# Patient Record
Sex: Female | Born: 1985 | Race: White | Hispanic: No | Smoking: Current every day smoker
Health system: Southern US, Community
[De-identification: ages and names within clinical notes are randomized; demographics above are authoritative.]

## PROBLEM LIST (undated history)

## (undated) DIAGNOSIS — F32A Depression, unspecified: Secondary | ICD-10-CM

## (undated) DIAGNOSIS — F329 Major depressive disorder, single episode, unspecified: Secondary | ICD-10-CM

## (undated) DIAGNOSIS — F1991 Other psychoactive substance use, unspecified, in remission: Secondary | ICD-10-CM

## (undated) HISTORY — PX: MOUTH SURGERY: SHX715

---
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
## 2006-10-05 ENCOUNTER — Inpatient Hospital Stay (HOSPITAL_COMMUNITY): Admission: AD | Admit: 2006-10-05 | Discharge: 2006-10-05 | Payer: Self-pay | Admitting: Obstetrics and Gynecology

## 2006-10-07 ENCOUNTER — Inpatient Hospital Stay (HOSPITAL_COMMUNITY): Admission: AD | Admit: 2006-10-07 | Discharge: 2006-10-07 | Payer: Self-pay | Admitting: Obstetrics and Gynecology

## 2006-10-20 ENCOUNTER — Inpatient Hospital Stay (HOSPITAL_COMMUNITY): Admission: AD | Admit: 2006-10-20 | Discharge: 2006-10-20 | Payer: Self-pay | Admitting: Obstetrics & Gynecology

## 2006-10-21 ENCOUNTER — Inpatient Hospital Stay (HOSPITAL_COMMUNITY): Admission: AD | Admit: 2006-10-21 | Discharge: 2006-10-26 | Payer: Self-pay | Admitting: Obstetrics & Gynecology

## 2006-10-23 ENCOUNTER — Encounter (INDEPENDENT_AMBULATORY_CARE_PROVIDER_SITE_OTHER): Payer: Self-pay | Admitting: Obstetrics and Gynecology

## 2008-01-01 ENCOUNTER — Inpatient Hospital Stay (HOSPITAL_COMMUNITY): Admission: AD | Admit: 2008-01-01 | Discharge: 2008-01-01 | Payer: Self-pay | Admitting: Obstetrics and Gynecology

## 2008-04-25 ENCOUNTER — Inpatient Hospital Stay (HOSPITAL_COMMUNITY): Admission: AD | Admit: 2008-04-25 | Discharge: 2008-04-25 | Payer: Self-pay | Admitting: Obstetrics & Gynecology

## 2010-03-10 ENCOUNTER — Encounter: Payer: Self-pay | Admitting: Sports Medicine

## 2010-07-02 NOTE — Op Note (Signed)
NAMEPARRIS, SIGNER              ACCOUNT NO.:  [id number]   MEDICAL RECORD NO.:  [id number]          PATIENT TYPE:  INP   LOCATION:  9373                          FACILITY:  [facility]   PHYSICIAN:  Kendra H. Tenny Craw, MD     DATE OF BIRTH:  1986-02-11   DATE OF PROCEDURE:  10/23/2006  DATE OF DISCHARGE:                               OPERATIVE REPORT   PREOPERATIVE DIAGNOSES:  1. A 36 and 3-week intrauterine pregnancy.  2. Nonreassuring fetal well-being.  3. Preeclampsia.  4. Possible chin presentation.  5. Dysfunctional labor.   POSTOPERATIVE DIAGNOSES:  1. A 36 and 3-week intrauterine pregnancy.  2. Nonreassuring fetal well-being.  3. Preeclampsia.  4. Possible chin presentation.  5. Dysfunctional labor.  6. Occiput posterior presentation, asynclitic.  7. Nuchal cord x2.   PROCEDURE:  Primary low transverse cesarean section via Pfannenstiel's  skin incision.   SURGEON:  Freddrick March. Tenny Craw, M.D.   ASSISTANT:  None.   ANESTHESIA:  Epidural.   SPECIMENS:  Placenta.  Placenta for disposal following the procedure.   ESTIMATED BLOOD LOSS:  600 mL.   COMPLICATIONS:  None.   FINDINGS:  Vigorous female infant in vertex and slightly asynclitic  occiput posterior presentation weighing 4 pounds 15 ounces with Apgar  scores of 9 and 9.  Cord pH was 7.33.  Normal-appearing ovaries, tubes  and uterus.   PROCEDURE:  Ms. Breeding is a 25 year old G1 P0 who presented for  evaluation of elevated blood pressures on October 21, 2006.  She had  been followed at the end of this pregnancy for elevated blood pressures  noted to be having blood pressures of 140s-150s over 80s-90s in the  office earlier in the week and monitoring her blood pressures at home.  Upon presentation on September 3, she was noted to have blood pressures  in the 170s-110.  Preeclampsia labs at that time were normal, but she  did have 1+ proteinuria; however, otherwise was asymptomatic.  Given the  significant range blood  pressures and proteinuria, the decision was made  to admit the patient and induce labor.  She initially underwent  induction of labor with misoprostol followed by Pitocin.  An amniotomy  was performed at 1630 on October 22, 2006.  At that time, she was tight  3 cm dilated, 70% and -2 station.  An intrauterine pressure catheter and  fetal scalp electrode were placed at that time.  The patient was noted  to have a very dysfunctional labor pattern.  She was have a series of 4-  5 contractions back to back and then no contractions for 5 minutes.  Multiple attempts were made to adjust the Pitocin to achieve adequate  labor.  However, the patient never did achieve adequate labor.  At 2300,  she was evaluated because the intrauterine pressure catheter was no  longer registering contractions and to see if any further progress had  been made.  Upon cervical exam, the cervix was found to be 4-5 cm  dilated, completely effaced and -1 station.  The intrauterine pressure  catheter was removed and replaced.  Return of blood  was noted after  placement of the intrauterine pressure catheter.  The infant was noted  to have decelerations to 90s for approximately 2 minutes following  intrauterine pressure catheter placement.  However, this responded well  to scalp stimulation.  Position changes were attempted.  However, the  infant was noted to have had a baseline change.  Baseline was now 110-  120s.  Accelerations were noted.  However, on further cervical exam,  there was concern that the baby had a chin presentation.  Sutures were  ill defined, and given the inability to achieve adequate labor, fetal  intolerance of labor and concern for chin presentation, the decision was  made to proceed with primary cesarean section.   Following the appropriate informed consent, the patient was brought to  the operating room where epidural anesthesia was found to be adequate.  She was placed in the dorsal supine  position with a leftward tilt,  prepped and draped in normal sterile fashion.  Scalpel was used to make  a Pfannenstiel's skin incision which was carried down through the  underlying layers of soft tissue to the fascia.  Fascia was incised in  the midline.  Fascial incision was extended laterally with Mayo  scissors.  Superior aspect of the fascial incision was grasped with  Kocher clamps x2, tented up.  Underlying rectus muscle was dissected off  sharply with the electrocautery unit.  The rectus muscles were then  separated in the midline.  The abdominal peritoneum was identified and  entered up with blunt dissection, and the incision in the peritoneum was  extended superiorly and inferiorly with good visualization of the  bladder.  The Alexis retractor was then placed intra-abdominally and  erected in the appropriate fashion with care taken to assure that no  bowel or omentum was caught between the retractor and the anterior  abdominal wall.  The vesicouterine peritoneum was then identified,  tented up, and entered sharply with Metzenbaum scissors, and the  incision was extended laterally with Metzenbaums, and the bladder flap  was created digitally.  Scalpel was then used to make a low transverse  incision in the uterus which was extended laterally with blunt  dissection.  The fetal vertex was then identified and brought up through  the uterine incision followed by delivery of the body.  A nuchal cord x2  was noted.  The cord was clamped and cut.  The infant was passed to the  waiting pediatricians.  Cord pH was collected.  The placenta was then  spontaneously delivered.  The uterus was exteriorized, cleared of all  clot and debris.  Uterine incision was repaired with 0 chromic in a  running fashion with a second imbricating layer.  Uterine incision was  found to be hemostatic.  The ovaries and tubes were inspected and found  to be normal.  The uterus was returned to the abdominal  cavity.  The  abdominal cavity was cleared of all clot and debris.  Uterine incision  was reinspected and again found to be hemostatic.  The Alexis retractor  was then disassembled, and the abdominal peritoneum was identified and  closed with 2-0 Vicryl in a running fashion.  The fascia was then closed  with a looped PDS in a running fashion, and the skin was closed with  staples.  All sponge, lap and needle counts were correct x2.  The  patient tolerated the procedure well and was brought to the recovery  room in stable condition following the procedure.  Freddrick March. Tenny Craw, MD  Electronically Signed     KHR/MEDQ  D:  10/23/2006  T:  10/23/2006  Job:  [id number]

## 2010-07-05 NOTE — Discharge Summary (Signed)
Christine Fernandez, Christine Fernandez              ACCOUNT NO.:  [id number]   MEDICAL RECORD NO.:  [id number]          PATIENT TYPE:  INP   LOCATION:  9145                          FACILITY:  [facility]   PHYSICIAN:  Gerrit Friends. Aldona Bar, M.D.   DATE OF BIRTH:  09-Apr-1985   DATE OF ADMISSION:  10/21/2006  DATE OF DISCHARGE:  10/26/2006                               DISCHARGE SUMMARY   FINAL DIAGNOSES:  1. Intrauterine pregnancy at 38-3/7 weeks' gestation.  2. Nonreassuring fetal well-being.  3. Preeclampsia.  4. Dysfunctional labor.  5. Occiput posterior presentation/  6. Nuchal cord x2.   PROCEDURE:  Primary low transverse cesarean section.  Surgeon Dr. Waynard Reeds.  Complications none.   This 25 year old, G1, P0 presents on September 3 with some elevated  blood pressures.  The patient has been followed at the end of this  pregnancy for elevated blood pressures.  She has been asymptomatic and  has had reassuring fetal assessment.  Upon presentation, the patient's  blood pressures were 170s/110s, 1+ proteinuria, and she was admitted at  this time.  The patient's antepartum course up to this point had been  complicated by a history of smoking.  Cessation was discussed with the  patient throughout her pregnancy.  The patient also is being followed  for an abnormal Pap which will be repeated after the baby is born.  Otherwise, patient did have an abnormal 1-hour sugar test but a normal 3  hour.  She is admitted at this time on the third with preeclampsia.  Induction of labor was started.  She was given misoprostol followed by  Pitocin.  Amniotomy was carried out on the 4th.  The patient was still  only about 3 cm dilated at that time.  IUPCs were placed.  The patient  was noted to have a very dysfunctional labor pattern.  Pitocin was  adjusted multiple times but never achieved an adequate labor pattern.  Cervix was still about only about 4 to 5 cm dilated after being  rechecked.  After IUPCs were replaced,  infant was noted to have  deceleration to the 90s for about 2 minutes following placement, and  this did respond well to scalp stimulation.  Position changes were  attempted.  However, the infant was noted to have some baseline change  into 110s/120s.  At this point, on further exam, there was concern that  the baby was in a chin presentation.  Discussion was held with the  patient secondary to this dysfunctional labor pattern, abnormal  presentation and fetal intolerance to labor.  A decision at this point  was made to proceed with a cesarean section.  The patient was taken to  the operating room on October 23, 2006 by Dr. Waynard Reeds where a  primary low transverse cesarean section was performed with the delivery  of a 4 pound 15 ounce female infant with Apgars of 9 and 9.  A cord pH was  obtained at 7.33.  There was a nuchal cord x2 noted.  The cesarean  section went without complications.  The patient's postoperative course,  she was continued  on mag sulfate for 24 hours after delivery.  Blood  pressures were returning to normal.  Postoperative day #2, the patient  was able to be weaned off the mag sulfate and transferred to the regular  mother/baby unit.  Blood pressures did go up, and the patient was placed  on labetalol 300 mg every 8 hours with a decrease in blood pressure  after that.  The patient also received a consultation by clinical social  worker to check on the patient's status.  The patient was kept in the  hospital until September 8 just watching her blood pressures.  At this  point, she was felt ready for discharge.  She was sent home on a regular  diet, told to decrease activities, told to continue her vitamins, and  was told to continue her Procardia XL 30 mg once daily for her blood  pressure, was given Percocet 1 to 2 every 4 hours as needed for pain.  She did receive her rubella vaccine prior to discharge.  She was to  check her blood pressures 4 times a day at home  and call if any were  elevated above 160/90.  She was to follow up in our office in 1 week to  recheck, and of course, precautions and instructions were reviewed with  the patient.   LABORATORY DATA ON DISCHARGE:  She had a hemoglobin of 10.4, white blood  cell count of 7.3, platelets of 235,000.  The patient never had any  elevation of her liver function tests and had normal LDH and uric acid  levels.      Leilani Able, P.A.-C.      Gerrit Friends. Aldona Bar, M.D.  Electronically Signed    MB/MEDQ  D:  01/07/2007  T:  01/07/2007  Job:  [id number]

## 2010-11-19 LAB — WET PREP, GENITAL: Trich, Wet Prep: NONE SEEN

## 2010-11-29 LAB — COMPREHENSIVE METABOLIC PANEL
ALT: 8
ALT: <8
ALT: 11
AST: 14
AST: 18
Albumin: 2.3 — ABNORMAL LOW
Alkaline Phosphatase: 108
Alkaline Phosphatase: 146 — ABNORMAL HIGH
BUN: 2 — ABNORMAL LOW
BUN: 3 — ABNORMAL LOW
BUN: 3 — ABNORMAL LOW
CO2: 24
CO2: 30
CO2: 23
Calcium: 6.8 — ABNORMAL LOW
Calcium: 7.5 — ABNORMAL LOW
Calcium: 8.8
Calcium: 8.8
Calcium: 8.6
Creatinine, Ser: 0.42
Creatinine, Ser: 0.49
Creatinine, Ser: 0.51
GFR calc Af Amer: >60
GFR calc Af Amer: >60
GFR calc non Af Amer: >60
GFR calc non Af Amer: >60
GFR calc non Af Amer: >60
Glucose, Bld: 80
Glucose, Bld: 83
Glucose, Bld: 84
Glucose, Bld: 85
Glucose, Bld: 75
Potassium: 3.5
Potassium: 4.1
Potassium: 4.5
Sodium: 136
Sodium: 136
Sodium: 137
Sodium: 134 — ABNORMAL LOW
Total Bilirubin: 0.5
Total Protein: 4.2 — ABNORMAL LOW
Total Protein: 4.5 — ABNORMAL LOW
Total Protein: 5.1 — ABNORMAL LOW
Total Protein: 5.6 — ABNORMAL LOW

## 2010-11-29 LAB — URIC ACID
Uric Acid, Serum: 3.7
Uric Acid, Serum: 4

## 2010-11-29 LAB — CBC
HCT: 29.1 — ABNORMAL LOW
HCT: 31.7 — ABNORMAL LOW
HCT: 37.6
HCT: 37.4
Hemoglobin: 10.1 — ABNORMAL LOW
Hemoglobin: 10.4 — ABNORMAL LOW
Hemoglobin: 11.3 — ABNORMAL LOW
Hemoglobin: 12.7
Hemoglobin: 13
Hemoglobin: 13
Hemoglobin: 13.3
Hemoglobin: 13.2
MCHC: 34.8
MCHC: 35.3
MCHC: 35.4
MCHC: 35.5
MCHC: 35.6
MCHC: 35.4
MCV: 90.6
MCV: 90.8
MCV: 91.5
MCV: 92.1
MCV: 91.8
Platelets: 149 — ABNORMAL LOW
Platelets: 161
Platelets: 170
Platelets: 235
RBC: 3.14 — ABNORMAL LOW
RBC: 3.99
RBC: 4.02
RBC: 4.15
RBC: 4.07
RDW: 12.1
RDW: 12.2
RDW: 12.4
WBC: 11.3 — ABNORMAL HIGH
WBC: 9.9

## 2010-11-29 LAB — DIFFERENTIAL
Basophils Relative: 0
Lymphocytes Relative: 21
Lymphs Abs: 1.5
Monocytes Relative: 5
Neutro Abs: 5.1
Neutrophils Relative %: 69

## 2010-11-29 LAB — URINALYSIS, ROUTINE W REFLEX MICROSCOPIC
Glucose, UA: NEGATIVE
Ketones, ur: NEGATIVE
Leukocytes, UA: NEGATIVE
Nitrite: NEGATIVE
Nitrite: NEGATIVE
Specific Gravity, Urine: 1.02
Urobilinogen, UA: 2 — ABNORMAL HIGH
pH: 7

## 2010-11-29 LAB — MAGNESIUM: Magnesium: 4.8 — ABNORMAL HIGH

## 2010-11-29 LAB — URINE MICROSCOPIC-ADD ON: RBC / HPF: NONE SEEN

## 2010-11-29 LAB — RPR: RPR Ser Ql: NONREACTIVE

## 2010-11-29 LAB — LACTATE DEHYDROGENASE
LDH: 114
LDH: 187
LDH: 122

## 2014-08-17 ENCOUNTER — Other Ambulatory Visit (HOSPITAL_COMMUNITY): Payer: Self-pay | Admitting: Obstetrics and Gynecology

## 2014-08-17 DIAGNOSIS — O365930 Maternal care for other known or suspected poor fetal growth, third trimester, not applicable or unspecified: Secondary | ICD-10-CM

## 2014-08-18 ENCOUNTER — Ambulatory Visit (HOSPITAL_COMMUNITY)
Admission: RE | Admit: 2014-08-18 | Discharge: 2014-08-18 | Disposition: A | Discharge status: Home / Self Care | Payer: Medicaid Other | Source: Ambulatory Visit | Attending: Obstetrics and Gynecology | Admitting: Obstetrics and Gynecology

## 2014-08-18 ENCOUNTER — Encounter (HOSPITAL_COMMUNITY): Payer: Self-pay

## 2014-08-18 ENCOUNTER — Other Ambulatory Visit (HOSPITAL_COMMUNITY): Payer: Self-pay | Admitting: Obstetrics and Gynecology

## 2014-08-18 DIAGNOSIS — O403XX0 Polyhydramnios, third trimester, not applicable or unspecified: Secondary | ICD-10-CM

## 2014-08-18 DIAGNOSIS — O365930 Maternal care for other known or suspected poor fetal growth, third trimester, not applicable or unspecified: Secondary | ICD-10-CM

## 2014-08-18 DIAGNOSIS — Z36 Encounter for antenatal screening of mother: Secondary | ICD-10-CM | POA: Insufficient documentation

## 2014-08-18 DIAGNOSIS — F1920 Other psychoactive substance dependence, uncomplicated: Secondary | ICD-10-CM | POA: Insufficient documentation

## 2014-08-18 DIAGNOSIS — O365990 Maternal care for other known or suspected poor fetal growth, unspecified trimester, not applicable or unspecified: Secondary | ICD-10-CM | POA: Insufficient documentation

## 2014-08-18 DIAGNOSIS — O09213 Supervision of pregnancy with history of pre-term labor, third trimester: Secondary | ICD-10-CM | POA: Diagnosis not present

## 2014-08-18 DIAGNOSIS — O3421 Maternal care for scar from previous cesarean delivery: Secondary | ICD-10-CM | POA: Insufficient documentation

## 2014-08-18 DIAGNOSIS — Z3689 Encounter for other specified antenatal screening: Secondary | ICD-10-CM | POA: Insufficient documentation

## 2014-08-18 DIAGNOSIS — O99320 Drug use complicating pregnancy, unspecified trimester: Secondary | ICD-10-CM

## 2014-08-18 DIAGNOSIS — O0933 Supervision of pregnancy with insufficient antenatal care, third trimester: Secondary | ICD-10-CM | POA: Diagnosis not present

## 2014-08-18 DIAGNOSIS — Z3A33 33 weeks gestation of pregnancy: Secondary | ICD-10-CM | POA: Insufficient documentation

## 2014-08-18 HISTORY — DX: Major depressive disorder, single episode, unspecified: F32.9

## 2014-08-18 HISTORY — DX: Depression, unspecified: F32.A

## 2014-09-08 ENCOUNTER — Ambulatory Visit (HOSPITAL_COMMUNITY): Payer: Medicaid Other

## 2015-06-23 ENCOUNTER — Encounter (HOSPITAL_COMMUNITY): Payer: Self-pay | Admitting: *Deleted

## 2022-02-25 ENCOUNTER — Encounter (HOSPITAL_BASED_OUTPATIENT_CLINIC_OR_DEPARTMENT_OTHER): Payer: Self-pay | Admitting: Orthopedic Surgery

## 2022-02-25 ENCOUNTER — Other Ambulatory Visit: Payer: Self-pay

## 2022-02-25 NOTE — Progress Notes (Signed)
Pt's medical record indicates hx of drug use, Erich Montane RN reviewed limited hx available with Dr Kalman Shan- pt to come in early on [facility] for uds.  Pre op call complete. Pt is a smoker and coughing on call. Denies any fever/ illness. Asked to abstain from smoking for 24 hrs prior to surgery. Pt denies any drug use in 'years'.     02/25/22 1504  PAT Phone Screen  Is the patient taking a GLP-1 receptor agonist? No  Do You Have Diabetes? No  Do You Have Hypertension? No  Have You Ever Been to the ER for Asthma? No  Have You Taken Oral Steroids in the Past 3 Months? No  Do you Take Phenteramine or any Other Diet Drugs? No  Recent  Lab Work, EKG, CXR? No  Do you have a history of heart problems? No  Any Recent Hospitalizations? No  Height 5\' 2"  (1.575 m)  Weight 70.3 kg  Pat Appointment Scheduled No  Reason for No Appointment Pt. Lives Out of [employer]

## 2022-02-26 ENCOUNTER — Other Ambulatory Visit (HOSPITAL_COMMUNITY): Payer: Self-pay | Admitting: Orthopedic Surgery

## 2022-02-26 NOTE — Progress Notes (Signed)
Dr Brunetta Genera CMP (questioned due to pt's hx chronic drug use)

## 2022-02-27 ENCOUNTER — Ambulatory Visit (HOSPITAL_BASED_OUTPATIENT_CLINIC_OR_DEPARTMENT_OTHER): Payer: No Typology Code available for payment source | Admitting: Anesthesiology

## 2022-02-27 ENCOUNTER — Encounter (HOSPITAL_BASED_OUTPATIENT_CLINIC_OR_DEPARTMENT_OTHER)
Admission: RE | Disposition: A | Discharge status: Home / Self Care | Payer: Self-pay | Source: Home / Self Care | Attending: Orthopedic Surgery

## 2022-02-27 ENCOUNTER — Encounter (HOSPITAL_BASED_OUTPATIENT_CLINIC_OR_DEPARTMENT_OTHER): Payer: Self-pay | Admitting: Orthopedic Surgery

## 2022-02-27 ENCOUNTER — Other Ambulatory Visit: Payer: Self-pay

## 2022-02-27 ENCOUNTER — Ambulatory Visit (HOSPITAL_BASED_OUTPATIENT_CLINIC_OR_DEPARTMENT_OTHER)
Admission: RE | Admit: 2022-02-27 | Discharge: 2022-02-27 | Disposition: A | Discharge status: Home / Self Care | Payer: No Typology Code available for payment source | Attending: Orthopedic Surgery | Admitting: Orthopedic Surgery

## 2022-02-27 ENCOUNTER — Ambulatory Visit (HOSPITAL_BASED_OUTPATIENT_CLINIC_OR_DEPARTMENT_OTHER): Payer: No Typology Code available for payment source

## 2022-02-27 DIAGNOSIS — F17210 Nicotine dependence, cigarettes, uncomplicated: Secondary | ICD-10-CM | POA: Diagnosis not present

## 2022-02-27 DIAGNOSIS — X501XXA Overexertion from prolonged static or awkward postures, initial encounter: Secondary | ICD-10-CM | POA: Diagnosis not present

## 2022-02-27 DIAGNOSIS — F1120 Opioid dependence, uncomplicated: Secondary | ICD-10-CM | POA: Diagnosis not present

## 2022-02-27 DIAGNOSIS — F1290 Cannabis use, unspecified, uncomplicated: Secondary | ICD-10-CM | POA: Diagnosis not present

## 2022-02-27 DIAGNOSIS — S8262XA Displaced fracture of lateral malleolus of left fibula, initial encounter for closed fracture: Secondary | ICD-10-CM

## 2022-02-27 DIAGNOSIS — Z01818 Encounter for other preprocedural examination: Secondary | ICD-10-CM

## 2022-02-27 HISTORY — PX: ORIF ANKLE FRACTURE: SHX5408

## 2022-02-27 HISTORY — DX: Other psychoactive substance use, unspecified, in remission: F19.91

## 2022-02-27 LAB — POCT PREGNANCY, URINE: Preg Test, Ur: NEGATIVE

## 2022-02-27 SURGERY — OPEN REDUCTION INTERNAL FIXATION (ORIF) ANKLE FRACTURE
Anesthesia: Regional | Site: Ankle | Laterality: Left

## 2022-02-27 MED ORDER — BUPIVACAINE HCL (PF) 0.5 % IJ SOLN
INTRAMUSCULAR | Status: DC | PRN
  Administered 2022-02-27: 40 mL via PERINEURAL

## 2022-02-27 MED ORDER — KETOROLAC TROMETHAMINE 30 MG/ML IJ SOLN: 30.0000 mg | Freq: Once | INTRAMUSCULAR | Status: DC | PRN

## 2022-02-27 MED ORDER — ACETAMINOPHEN 500 MG PO TABS
ORAL_TABLET | ORAL | Status: AC
  Filled 2022-02-27: qty 2

## 2022-02-27 MED ORDER — VANCOMYCIN HCL 500 MG IV SOLR
INTRAVENOUS | Status: AC
  Filled 2022-02-27: qty 10

## 2022-02-27 MED ORDER — MIDAZOLAM HCL 2 MG/2ML IJ SOLN
INTRAMUSCULAR | Status: AC
  Filled 2022-02-27: qty 2

## 2022-02-27 MED ORDER — LIDOCAINE HCL (CARDIAC) PF 100 MG/5ML IV SOSY
PREFILLED_SYRINGE | INTRAVENOUS | Status: DC | PRN
  Administered 2022-02-27: 30 mg via INTRAVENOUS

## 2022-02-27 MED ORDER — FENTANYL CITRATE (PF) 100 MCG/2ML IJ SOLN
INTRAMUSCULAR | Status: AC
  Filled 2022-02-27: qty 2

## 2022-02-27 MED ORDER — VANCOMYCIN HCL 500 MG IV SOLR
INTRAVENOUS | Status: DC | PRN
  Administered 2022-02-27: 500 mg via TOPICAL

## 2022-02-27 MED ORDER — OXYCODONE HCL 5 MG PO TABS: 5.0000 mg | ORAL_TABLET | Freq: Once | ORAL | Status: DC | PRN

## 2022-02-27 MED ORDER — AMISULPRIDE (ANTIEMETIC) 5 MG/2ML IV SOLN: 10.0000 mg | Freq: Once | INTRAVENOUS | Status: DC | PRN

## 2022-02-27 MED ORDER — MEPERIDINE HCL 25 MG/ML IJ SOLN: 6.2500 mg | INTRAMUSCULAR | Status: DC | PRN

## 2022-02-27 MED ORDER — FENTANYL CITRATE (PF) 100 MCG/2ML IJ SOLN
100.0000 ug | Freq: Once | INTRAMUSCULAR | Status: AC
  Administered 2022-02-27: 100 ug via INTRAVENOUS

## 2022-02-27 MED ORDER — CEFAZOLIN SODIUM-DEXTROSE 2-4 GM/100ML-% IV SOLN
2.0000 g | INTRAVENOUS | Status: AC
  Administered 2022-02-27: 2 g via INTRAVENOUS

## 2022-02-27 MED ORDER — DEXAMETHASONE SODIUM PHOSPHATE 10 MG/ML IJ SOLN
INTRAMUSCULAR | Status: DC | PRN
  Administered 2022-02-27: 4 mg
  Administered 2022-02-27: 10 mg

## 2022-02-27 MED ORDER — PROPOFOL 10 MG/ML IV BOLUS
INTRAVENOUS | Status: DC | PRN
  Administered 2022-02-27: 200 mg via INTRAVENOUS

## 2022-02-27 MED ORDER — HYDROMORPHONE HCL 1 MG/ML IJ SOLN: 0.2500 mg | INTRAMUSCULAR | Status: DC | PRN

## 2022-02-27 MED ORDER — SODIUM CHLORIDE 0.9 % IV SOLN: INTRAVENOUS | Status: DC

## 2022-02-27 MED ORDER — MIDAZOLAM HCL 2 MG/2ML IJ SOLN
2.0000 mg | Freq: Once | INTRAMUSCULAR | Status: AC
  Administered 2022-02-27: 2 mg via INTRAVENOUS

## 2022-02-27 MED ORDER — ONDANSETRON HCL 4 MG/2ML IJ SOLN: 4.0000 mg | Freq: Once | INTRAMUSCULAR | Status: DC | PRN

## 2022-02-27 MED ORDER — 0.9 % SODIUM CHLORIDE (POUR BTL) OPTIME
TOPICAL | Status: DC | PRN
  Administered 2022-02-27: 300 mL

## 2022-02-27 MED ORDER — ACETAMINOPHEN 500 MG PO TABS
1000.0000 mg | ORAL_TABLET | Freq: Once | ORAL | Status: AC
  Administered 2022-02-27: 1000 mg via ORAL

## 2022-02-27 MED ORDER — OXYCODONE HCL 5 MG/5ML PO SOLN: 5.0000 mg | Freq: Once | ORAL | Status: DC | PRN

## 2022-02-27 MED ORDER — LACTATED RINGERS IV SOLN: INTRAVENOUS | Status: DC | PRN

## 2022-02-27 MED ORDER — ONDANSETRON HCL 4 MG/2ML IJ SOLN
INTRAMUSCULAR | Status: DC | PRN
  Administered 2022-02-27: 4 mg via INTRAVENOUS

## 2022-02-27 MED ORDER — PROPOFOL 500 MG/50ML IV EMUL
INTRAVENOUS | Status: DC | PRN
  Administered 2022-02-27: 25 ug/kg/min via INTRAVENOUS

## 2022-02-27 MED ORDER — CEFAZOLIN SODIUM-DEXTROSE 2-4 GM/100ML-% IV SOLN
INTRAVENOUS | Status: AC
  Filled 2022-02-27: qty 100

## 2022-02-27 MED ORDER — RIVAROXABAN 10 MG PO TABS: 10.0000 mg | ORAL_TABLET | Freq: Every day | ORAL | 0 refills | Status: AC

## 2022-02-27 SURGICAL SUPPLY — 68 items
APL PRP STRL LF DISP 70% ISPRP (MISCELLANEOUS) ×1
BANDAGE ESMARK 6X9 LF (GAUZE/BANDAGES/DRESSINGS) IMPLANT
BIT DRILL 2.5X2.75 QC CALB (BIT) IMPLANT
BIT DRILL 3.5X5.5 QC CALB (BIT) IMPLANT
BLADE SURG 15 STRL LF DISP TIS (BLADE) ×4 IMPLANT
BLADE SURG 15 STRL SS (BLADE) ×2
BNDG CMPR 9X4 STRL LF SNTH (GAUZE/BANDAGES/DRESSINGS)
BNDG CMPR 9X6 STRL LF SNTH (GAUZE/BANDAGES/DRESSINGS)
BNDG ELASTIC 4X5.8 VLCR STR LF (GAUZE/BANDAGES/DRESSINGS) ×2 IMPLANT
BNDG ELASTIC 6X5.8 VLCR STR LF (GAUZE/BANDAGES/DRESSINGS) ×2 IMPLANT
BNDG ESMARK 4X9 LF (GAUZE/BANDAGES/DRESSINGS) IMPLANT
BNDG ESMARK 6X9 LF (GAUZE/BANDAGES/DRESSINGS)
CANISTER SUCT 1200ML W/VALVE (MISCELLANEOUS) ×2 IMPLANT
CHLORAPREP W/TINT 26 (MISCELLANEOUS) ×2 IMPLANT
COVER BACK TABLE 60X90IN (DRAPES) ×2 IMPLANT
CUFF TOURN SGL QUICK 34 (TOURNIQUET CUFF)
CUFF TRNQT CYL 34X4.125X (TOURNIQUET CUFF) IMPLANT
DRAPE EXTREMITY T 121X128X90 (DISPOSABLE) ×2 IMPLANT
DRAPE OEC MINIVIEW 54X84 (DRAPES) ×2 IMPLANT
DRAPE U-SHAPE 47X51 STRL (DRAPES) ×2 IMPLANT
DRSG MEPITEL 4X7.2 (GAUZE/BANDAGES/DRESSINGS) ×2 IMPLANT
ELECT REM PT RETURN 9FT ADLT (ELECTROSURGICAL) ×1
ELECTRODE REM PT RTRN 9FT ADLT (ELECTROSURGICAL) ×2 IMPLANT
GAUZE PAD ABD 8X10 STRL (GAUZE/BANDAGES/DRESSINGS) ×4 IMPLANT
GAUZE SPONGE 4X4 12PLY STRL (GAUZE/BANDAGES/DRESSINGS) ×2 IMPLANT
GLOVE BIO SURGEON STRL SZ8 (GLOVE) ×2 IMPLANT
GLOVE BIOGEL PI IND STRL 8 (GLOVE) ×4 IMPLANT
GLOVE ECLIPSE 8.0 STRL XLNG CF (GLOVE) ×2 IMPLANT
GOWN STRL REUS W/ TWL LRG LVL3 (GOWN DISPOSABLE) ×2 IMPLANT
GOWN STRL REUS W/ TWL XL LVL3 (GOWN DISPOSABLE) ×4 IMPLANT
GOWN STRL REUS W/TWL LRG LVL3 (GOWN DISPOSABLE) ×1
GOWN STRL REUS W/TWL XL LVL3 (GOWN DISPOSABLE) ×2
NEEDLE HYPO 22GX1.5 SAFETY (NEEDLE) IMPLANT
NS IRRIG 1000ML POUR BTL (IV SOLUTION) ×2 IMPLANT
PACK BASIN DAY SURGERY FS (CUSTOM PROCEDURE TRAY) ×2 IMPLANT
PAD CAST 4YDX4 CTTN HI CHSV (CAST SUPPLIES) ×2 IMPLANT
PADDING CAST ABS COTTON 4X4 ST (CAST SUPPLIES) IMPLANT
PADDING CAST COTTON 4X4 STRL (CAST SUPPLIES) ×1
PADDING CAST COTTON 6X4 STRL (CAST SUPPLIES) ×2 IMPLANT
PENCIL SMOKE EVACUATOR (MISCELLANEOUS) ×2 IMPLANT
PLATE ACE 100DEG 6HOLE (Plate) IMPLANT
SANITIZER HAND PURELL FF 515ML (MISCELLANEOUS) ×2 IMPLANT
SCREW CORTICAL 3.5MM  16MM (Screw) ×3 IMPLANT
SCREW CORTICAL 3.5MM 14MM (Screw) IMPLANT
SCREW CORTICAL 3.5MM 16MM (Screw) IMPLANT
SCREW CORTICAL 3.5MM 18MM (Screw) IMPLANT
SCREW CORTICAL 3.5MM 24MM (Screw) IMPLANT
SHEET MEDIUM DRAPE 40X70 STRL (DRAPES) ×2 IMPLANT
SLEEVE SCD COMPRESS KNEE MED (STOCKING) ×2 IMPLANT
SPIKE FLUID TRANSFER (MISCELLANEOUS) IMPLANT
SPLINT PLASTER CAST FAST 5X30 (CAST SUPPLIES) ×40 IMPLANT
SPONGE T-LAP 18X18 ~~LOC~~+RFID (SPONGE) ×2 IMPLANT
STOCKINETTE 6  STRL (DRAPES) ×1
STOCKINETTE 6 STRL (DRAPES) ×2 IMPLANT
SUCTION FRAZIER HANDLE 10FR (MISCELLANEOUS) ×1
SUCTION TUBE FRAZIER 10FR DISP (MISCELLANEOUS) ×2 IMPLANT
SUT ETHILON 3 0 PS 1 (SUTURE) ×2 IMPLANT
SUT FIBERWIRE #2 38 T-5 BLUE (SUTURE)
SUT MNCRL AB 3-0 PS2 18 (SUTURE) IMPLANT
SUT VIC AB 2-0 SH 27 (SUTURE) ×1
SUT VIC AB 2-0 SH 27XBRD (SUTURE) ×2 IMPLANT
SUT VICRYL 0 SH 27 (SUTURE) IMPLANT
SUTURE FIBERWR #2 38 T-5 BLUE (SUTURE) IMPLANT
SYR BULB EAR ULCER 3OZ GRN STR (SYRINGE) ×2 IMPLANT
SYR CONTROL 10ML LL (SYRINGE) IMPLANT
TOWEL GREEN STERILE FF (TOWEL DISPOSABLE) ×4 IMPLANT
TUBE CONNECTING 20X1/4 (TUBING) ×2 IMPLANT
UNDERPAD 30X36 HEAVY ABSORB (UNDERPADS AND DIAPERS) ×2 IMPLANT

## 2022-02-27 NOTE — Discharge Instructions (Addendum)
Wylene Simmer, MD [facility]  Please read the following information regarding your care after surgery.  Medications  You only need a prescription for the narcotic pain medicine (ex. oxycodone, Percocet, Norco).  All of the other medicines listed below are available over the counter. X Aleve 2 pills twice a day for the first 3 days after surgery. X acetominophen (Tylenol) 650 mg every 4-6 hours as you need for pain. No Tylenol until after 3:30pm today.   X To help prevent blood clots, take Xarelto daily for two weeks after surgery.  You should also get up every hour while you are awake to move around.    Weight Bearing X  Do not bear any weight on the operated leg or foot.  Cast / Splint / Dressing X Keep your splint, cast or dressing clean and dry.  Don't put anything (coat hanger, pencil, etc) down inside of it.  If it gets damp, use a hair dryer on the cool setting to dry it.  If it gets soaked, call the office to schedule an appointment for a cast change.  After your dressing, cast or splint is removed; you may shower, but do not soak or scrub the wound.  Allow the water to run over it, and then gently pat it dry.  Swelling It is normal for you to have swelling where you had surgery.  To reduce swelling and pain, keep your toes above your nose for at least 3 days after surgery.  It may be necessary to keep your foot or leg elevated for several weeks.  If it hurts, it should be elevated.  Follow Up Call my office at [phone] when you are discharged from the hospital or surgery center to schedule an appointment to be seen two weeks after surgery.  Call my office at [phone] if you develop a fever >101.5 F, nausea, vomiting, bleeding from the surgical site or severe pain.     Post Anesthesia Home Care Instructions  Activity: Get plenty of rest for the remainder of the day. A responsible individual must stay with you for 24 hours following the procedure.  For the next 24  hours, DO NOT: -Drive a car -Paediatric nurse -Drink alcoholic beverages -Take any medication unless instructed by your physician -Make any legal decisions or sign important papers.  Meals: Start with liquid foods such as gelatin or soup. Progress to regular foods as tolerated. Avoid greasy, spicy, heavy foods. If nausea and/or vomiting occur, drink only clear liquids until the nausea and/or vomiting subsides. Call your physician if vomiting continues.  Special Instructions/Symptoms: Your throat may feel dry or sore from the anesthesia or the breathing tube placed in your throat during surgery. If this causes discomfort, gargle with warm salt water. The discomfort should disappear within 24 hours.  If you had a scopolamine patch placed behind your ear for the management of post- operative nausea and/or vomiting:  1. The medication in the patch is effective for 72 hours, after which it should be removed.  Wrap patch in a tissue and discard in the trash. Wash hands thoroughly with soap and water. 2. You may remove the patch earlier than 72 hours if you experience unpleasant side effects which may include dry mouth, dizziness or visual disturbances. 3. Avoid touching the patch. Wash your hands with soap and water after contact with the patch.   Regional Anesthesia Blocks  1. Numbness or the inability to move the "blocked" extremity may last from 3-48 hours after placement. The  length of time depends on the medication injected and your individual response to the medication. If the numbness is not going away after 48 hours, call your surgeon.  2. The extremity that is blocked will need to be protected until the numbness is gone and the  Strength has returned. Because you cannot feel it, you will need to take extra care to avoid injury. Because it may be weak, you may have difficulty moving it or using it. You may not know what position it is in without looking at it while the block is in  effect.  3. For blocks in the legs and feet, returning to weight bearing and walking needs to be done carefully. You will need to wait until the numbness is entirely gone and the strength has returned. You should be able to move your leg and foot normally before you try and bear weight or walk. You will need someone to be with you when you first try to ensure you do not fall and possibly risk injury.  4. Bruising and tenderness at the needle site are common side effects and will resolve in a few days.  5. Persistent numbness or new problems with movement should be communicated to the surgeon or the [facility] [phone] [place] [phone]).

## 2022-02-27 NOTE — Anesthesia Preprocedure Evaluation (Addendum)
Anesthesia Evaluation  Patient identified by MRN, date of birth, ID band Patient awake    Reviewed: Allergy & Precautions, NPO status , Patient's Chart, lab work & pertinent test results  Airway Mallampati: III  TM Distance: >3 FB Neck ROM: Full    Dental  (+) Missing, Dental Advisory Given, Chipped,  Poor dentition throughout:   Pulmonary Current Smoker 8cigg/d No inhalers   Pulmonary exam normal breath sounds clear to auscultation       Cardiovascular negative cardio ROS Normal cardiovascular exam Rhythm:Regular Rate:Normal     Neuro/Psych  PSYCHIATRIC DISORDERS  Depression    negative neurological ROS     GI/Hepatic negative GI ROS,,,(+)     substance abuse  cocaine use and marijuana use  Endo/Other  negative endocrine ROS    Renal/GU negative Renal ROS  negative genitourinary   Musculoskeletal negative musculoskeletal ROS (+)  narcotic dependent  Abdominal   Peds  Hematology negative hematology ROS (+)   Anesthesia Other Findings   Reproductive/Obstetrics negative OB ROS                             Anesthesia Physical Anesthesia Plan  ASA: 3  Anesthesia Plan: General and Regional   Post-op Pain Management: Tylenol PO (pre-op)*, Toradol IV (intra-op)* and Regional block*   Induction: Intravenous  PONV Risk Score and Plan: 2 and Ondansetron, Dexamethasone, Midazolam and Treatment may vary due to age or medical condition  Airway Management Planned: LMA  Additional Equipment: None  Intra-op Plan:   Post-operative Plan: Extubation in OR  Informed Consent: I have reviewed the patients History and Physical, chart, labs and discussed the procedure including the risks, benefits and alternatives for the proposed anesthesia with the patient or authorized representative who has indicated his/her understanding and acceptance.     Dental advisory given  Plan Discussed with:  CRNA  Anesthesia Plan Comments:        Anesthesia Quick Evaluation

## 2022-02-27 NOTE — Anesthesia Procedure Notes (Signed)
Date/Time: 02/27/2022 2:55 PM  Performed by: Signe Colt, CRNA

## 2022-02-27 NOTE — Anesthesia Procedure Notes (Signed)
Anesthesia Regional Block: Adductor canal block   Pre-Anesthetic Checklist: , timeout performed,  Correct Patient, Correct Site, Correct Laterality,  Correct Procedure, Correct Position, site marked,  Risks and benefits discussed,  Surgical consent,  Pre-op evaluation,  At surgeon's request and post-op pain management  Laterality: Left  Prep: Maximum Sterile Barrier Precautions used, chloraprep       Needles:  Injection technique: Single-shot  Needle Type: Echogenic Stimulator Needle     Needle Length: 9cm  Needle Gauge: 22     Additional Needles:   Procedures:,,,, ultrasound used (permanent image in chart),,    Narrative:  Start time: 02/27/2022 1:15 PM End time: 02/27/2022 1:20 PM Injection made incrementally with aspirations every 5 mL.  Performed by: Personally  Anesthesiologist: Pervis Hocking, DO  Additional Notes: Monitors applied. No increased pain on injection. No increased resistance to injection. Injection made in 5cc increments. Good needle visualization. Patient tolerated procedure well.

## 2022-02-27 NOTE — Op Note (Signed)
02/27/2022  3:34 PM  PATIENT:  Christine Fernandez  37 y.o. female  PRE-OPERATIVE DIAGNOSIS:  displaced left ankle lateral malleolus fracture  POST-OPERATIVE DIAGNOSIS:  same  Procedure(s):  1.  Open treatment left ankle lateral malleolus fracture with internal fixation   2.  Stress exam of left ankle under fluoro   3.  AP, lateral and mortise radiographs of the left ankle  SURGEON:  Wylene Simmer, MD  ASSISTANT: none  ANESTHESIA:   General, regional  EBL:  minimal   TOURNIQUET:   Total Tourniquet Time Documented: Thigh (Left) - 20 minutes Total: Thigh (Left) - 20 minutes  COMPLICATIONS:  None apparent  DISPOSITION:  Extubated, awake and stable to recovery.  INDICATION FOR PROCEDURE: 37 year old female with past medical history significant for polysubstance abuse injured her left ankle just over a week ago.  She has a displaced lateral malleolus fracture after twisting her ankle coming down the stairs.  She presents today for operative treatment of this displaced and unstable left ankle injury.  The risks and benefits of the alternative treatment options have been discussed in detail.  The patient wishes to proceed with surgery and specifically understands risks of bleeding, infection, nerve damage, blood clots, need for additional surgery, amputation and death.   PROCEDURE IN DETAIL:  After pre operative consent was obtained, and the correct operative site was identified, the patient was brought to the operating room and placed supine on the OR table.  Anesthesia was administered.  Pre-operative antibiotics were administered.  A surgical timeout was taken.  The left lower extremity was prepped and draped in standard sterile fashion with a tourniquet around the thigh.  The extremity was elevated, and the tourniquet was inflated to 250 mmHg.  A longitudinal incision was made over the lateral malleolus.  Dissection was carried sharply down through the subcutaneous tissues.  The fracture site  was identified.  It was mobilized and cleaned of all hematoma.  It was irrigated.  The fracture was reduced and held with a lobster-claw clamp.  Radiographs confirmed appropriate reduction of the fracture.  A 3.5 mm fully threaded lag screw from the [employer] small frag set was inserted across the fracture site from anterior to posterior.  It compressed the fracture site appropriately and had excellent purchase.  A 6 hole one third tubular plate was then contoured to fit the lateral malleolus.  It was secured proximally with 3 bicortical screws and distally with 3 unicortical screws.  AP, mortise and lateral radiographs showed appropriate reduction of the fracture and appropriate position and length of all hardware.  Stress examination was then performed.  Dorsiflexion and external rotation stress was applied to the supinated forefoot.  There was no evidence of syndesmosis or deltoid ligament injury.  The incision was irrigated copiously and sprinkled with vancomycin powder.  Subcutaneous tissues were approximated with Monocryl.  The skin was closed with running 3-0 nylon.  Sterile dressings were applied followed by a well-padded short leg splint.  The tourniquet was released after application of the dressings.  The patient was awakened from anesthesia and transported to the recovery room in stable condition.  FOLLOW UP PLAN: Nonweightbearing on the left lower extremity.  Follow-up in the office in 2 weeks for suture removal and conversion to a cam boot for early weightbearing and range of motion.  Xarelto for DVT prophylaxis given her history of smoking.   RADIOGRAPHS: AP, mortise and lateral radiographs of the left ankle are obtained intraoperatively.  These show interval  reduction and fixation of the lateral malleolus fracture.  Hardware is appropriately positioned and of the appropriate lengths.  No other acute injuries are noted.

## 2022-02-27 NOTE — Anesthesia Postprocedure Evaluation (Signed)
Anesthesia Post Note  Patient: Christine Fernandez  Procedure(s) Performed: OPEN REDUCTION INTERNAL FIXATION (ORIF) ANKLE FRACTURE, LATERAL MALLEOLUS (Left: Ankle)     Patient location during evaluation: PACU Anesthesia Type: Regional and General Level of consciousness: awake and alert, oriented and patient cooperative Pain management: pain level controlled Vital Signs Assessment: post-procedure vital signs reviewed and stable Respiratory status: spontaneous breathing, nonlabored ventilation and respiratory function stable Cardiovascular status: blood pressure returned to baseline and stable Postop Assessment: no apparent nausea or vomiting Anesthetic complications: no   No notable events documented.  Last Vitals:  Vitals:   02/27/22 1545 02/27/22 1600  BP: 108/78   Pulse: 74 78  Resp: 11 13  Temp:    SpO2: 100% 98%    Last Pain:  Vitals:   02/27/22 1537  TempSrc:   PainSc: [place]

## 2022-02-27 NOTE — H&P (Signed)
Christine Fernandez is an 37 y.o. female.   Chief Complaint: Left ankle pain HPI: 36 year old female with past medical history significant for polysubstance abuse complains of left ankle pain since a fall about 10 days ago.  She twisted her ankle coming down the stairs.  Radiographs revealed a displaced lateral malleolus fracture.  She presents now for operative treatment of this displaced and unstable left ankle injury.  Past Medical History:  Diagnosis Date   Depression    History of drug use    denies any use since 2022    Past Surgical History:  Procedure Laterality Date   CESAREAN SECTION     x4   MOUTH SURGERY      History reviewed. No pertinent family history. Social History:  reports that she has been smoking cigarettes. She has been smoking an average of .25 packs per day. She does not have any smokeless tobacco history on file. She reports current drug use. Drugs: Oxycodone, Marijuana, and "Crack" cocaine. She reports that she does not drink alcohol.  Allergies:  Allergies  Allergen Reactions   Tramadol     No medications prior to admission.    Results for orders placed or performed during the hospital encounter of 18-Mar-2022 (from the past 48 hour(s))  Pregnancy, urine POC     Status: None   Collection Time: 03/18/22  1:21 PM  Result Value Ref Range   Preg Test, Ur NEGATIVE NEGATIVE    Comment:        THE SENSITIVITY OF THIS METHODOLOGY IS >24 mIU/mL    No results found.  Review of Systems no recent fever, chills, nausea, vomiting or changes in  Blood pressure 110/61, pulse 93, temperature (!) 97.2 F (36.2 C), temperature source Oral, resp. rate 20, height 5\' 2"  (1.575 m), weight 77.8 kg, last menstrual period 02/10/2022, SpO2 99 %, unknown if currently breastfeeding. Physical Exam  Well-nourished well-developed woman in no apparent distress.  Alert and oriented.  Normal mood and affect.  Poor dentition.  Extraocular motions are intact.  Respirations are  unlabored.  Gait is nonweightbearing on the left.  Left ankle is swollen.  Tender to palpation over the lateral malleolus.  Pulses are palpable in the foot.  Healthy skin otherwise.  Intact sensibility to light touch in the saphenous and sural nerve distributions.  Assessment/Plan Displaced left ankle lateral malleolus fracture -to the operating room today for open treatment of the displaced left ankle fracture with internal fixation.  The risks and benefits of the alternative treatment options have been discussed in detail.  The patient wishes to proceed with surgery and specifically understands risks of bleeding, infection, nerve damage, blood clots, need for additional surgery, amputation and death.   Wylene Simmer, MD 2022-03-18, 2:35 PM

## 2022-02-27 NOTE — Transfer of Care (Signed)
Immediate Anesthesia Transfer of Care Note  Patient: Christine Fernandez  Procedure(s) Performed: OPEN REDUCTION INTERNAL FIXATION (ORIF) ANKLE FRACTURE, LATERAL MALLEOLUS (Left: Ankle)  Patient Location: PACU  Anesthesia Type:GA combined with regional for post-op pain  Level of Consciousness: drowsy  Airway & Oxygen Therapy: Patient Spontanous Breathing and Patient connected to face mask oxygen  Post-op Assessment: Report given to RN and Post -op Vital signs reviewed and stable  Post vital signs: Reviewed and stable  Last Vitals:  Vitals Value Taken Time  BP 110/70 02/27/22 1537  Temp    Pulse 75 02/27/22 1539  Resp 11 02/27/22 1539  SpO2 100 % 02/27/22 1539  Vitals shown include unvalidated device data.  Last Pain:  Vitals:   02/27/22 1228  TempSrc: [place]  PainSc: 0-No pain      Patients Stated Pain Goal: 0 (48/01/65 [id number])  Complications: No notable events documented.

## 2022-02-27 NOTE — Anesthesia Procedure Notes (Signed)
Anesthesia Regional Block: Popliteal block   Pre-Anesthetic Checklist: , timeout performed,  Correct Patient, Correct Site, Correct Laterality,  Correct Procedure, Correct Position, site marked,  Risks and benefits discussed,  Surgical consent,  Pre-op evaluation,  At surgeon's request and post-op pain management  Laterality: Left  Prep: Maximum Sterile Barrier Precautions used, chloraprep       Needles:  Injection technique: Single-shot  Needle Type: Echogenic Stimulator Needle     Needle Length: 9cm  Needle Gauge: 22     Additional Needles:   Procedures:,,,, ultrasound used (permanent image in chart),,    Narrative:  Start time: 02/27/2022 1:10 PM End time: 02/27/2022 1:15 PM Injection made incrementally with aspirations every 5 mL.  Performed by: Personally  Anesthesiologist: Pervis Hocking, DO  Additional Notes: Monitors applied. No increased pain on injection. No increased resistance to injection. Injection made in 5cc increments. Good needle visualization. Patient tolerated procedure well.

## 2022-02-27 NOTE — Progress Notes (Signed)
Assisted Dr. Finucane with left, adductor canal, popliteal, ultrasound guided block. Side rails up, monitors on throughout procedure. See vital signs in flow sheet. Tolerated Procedure well. 

## 2022-02-28 ENCOUNTER — Encounter (HOSPITAL_BASED_OUTPATIENT_CLINIC_OR_DEPARTMENT_OTHER): Payer: Self-pay | Admitting: Orthopedic Surgery
